# Patient Record
Sex: Male | Born: 1995 | Race: White | Hispanic: No | Marital: Single | Smoking: Never smoker
Health system: Southern US, Community
[De-identification: ages and names within clinical notes are randomized; demographics above are authoritative.]

---

## 2020-05-05 ENCOUNTER — Other Ambulatory Visit: Payer: Self-pay

## 2020-05-05 ENCOUNTER — Emergency Department (HOSPITAL_COMMUNITY): Payer: BC Managed Care – PPO

## 2020-05-05 ENCOUNTER — Emergency Department (HOSPITAL_COMMUNITY)
Admission: EM | Admit: 2020-05-05 | Discharge: 2020-05-05 | Disposition: A | Discharge status: Home / Self Care | Payer: BC Managed Care – PPO | Attending: Emergency Medicine | Admitting: Emergency Medicine

## 2020-05-05 ENCOUNTER — Encounter (HOSPITAL_COMMUNITY): Payer: Self-pay | Admitting: *Deleted

## 2020-05-05 DIAGNOSIS — R1084 Generalized abdominal pain: Secondary | ICD-10-CM | POA: Insufficient documentation

## 2020-05-05 DIAGNOSIS — R112 Nausea with vomiting, unspecified: Secondary | ICD-10-CM | POA: Insufficient documentation

## 2020-05-05 DIAGNOSIS — R197 Diarrhea, unspecified: Secondary | ICD-10-CM | POA: Insufficient documentation

## 2020-05-05 DIAGNOSIS — R109 Unspecified abdominal pain: Secondary | ICD-10-CM | POA: Diagnosis present

## 2020-05-05 LAB — URINALYSIS, ROUTINE W REFLEX MICROSCOPIC
Bilirubin Urine: NEGATIVE
Glucose, UA: NEGATIVE mg/dL
Hgb urine dipstick: NEGATIVE
Ketones, ur: NEGATIVE mg/dL
Leukocytes,Ua: NEGATIVE
Nitrite: NEGATIVE
Protein, ur: NEGATIVE mg/dL
Specific Gravity, Urine: 1.027 (ref 1.005–1.030)
pH: 5 (ref 5.0–8.0)

## 2020-05-05 LAB — CBC
HCT: 51.5 % (ref 39.0–52.0)
Hemoglobin: 17.1 g/dL — ABNORMAL HIGH (ref 13.0–17.0)
MCH: 30.9 pg (ref 26.0–34.0)
MCHC: 33.2 g/dL (ref 30.0–36.0)
MCV: 93 fL (ref 80.0–100.0)
Platelets: 241 10*3/uL (ref 150–400)
RBC: 5.54 MIL/uL (ref 4.22–5.81)
RDW: 12.6 % (ref 11.5–15.5)
WBC: 9 10*3/uL (ref 4.0–10.5)
nRBC: 0 % (ref 0.0–0.2)

## 2020-05-05 LAB — COMPREHENSIVE METABOLIC PANEL
ALT: 33 U/L (ref 0–44)
AST: 21 U/L (ref 15–41)
Albumin: 4.3 g/dL (ref 3.5–5.0)
Alkaline Phosphatase: 49 U/L (ref 38–126)
Anion gap: 10 (ref 5–15)
BUN: 15 mg/dL (ref 6–20)
CO2: 25 mmol/L (ref 22–32)
Calcium: 9 mg/dL (ref 8.9–10.3)
Chloride: 104 mmol/L (ref 98–111)
Creatinine, Ser: 1.02 mg/dL (ref 0.61–1.24)
GFR, Estimated: >60 mL/min (ref >60)
Glucose, Bld: 98 mg/dL (ref 70–99)
Potassium: 3.6 mmol/L (ref 3.5–5.1)
Sodium: 139 mmol/L (ref 135–145)
Total Bilirubin: 1.2 mg/dL (ref 0.3–1.2)
Total Protein: 7.4 g/dL (ref 6.5–8.1)

## 2020-05-05 LAB — LIPASE, BLOOD: Lipase: 22 U/L (ref 11–51)

## 2020-05-05 MED ORDER — DICYCLOMINE HCL 20 MG PO TABS: 20.0000 mg | ORAL_TABLET | Freq: Two times a day (BID) | ORAL | 0 refills | Status: AC

## 2020-05-05 MED ORDER — IOHEXOL 300 MG/ML  SOLN
100.0000 mL | Freq: Once | INTRAMUSCULAR | Status: AC | PRN
  Administered 2020-05-05: 100 mL via INTRAVENOUS

## 2020-05-05 MED ORDER — DICYCLOMINE HCL 10 MG PO CAPS
10.0000 mg | ORAL_CAPSULE | Freq: Once | ORAL | Status: AC
  Administered 2020-05-05: 10 mg via ORAL
  Filled 2020-05-05: qty 1

## 2020-05-05 MED ORDER — LOPERAMIDE HCL 2 MG PO CAPS: 2.0000 mg | ORAL_CAPSULE | Freq: Four times a day (QID) | ORAL | 0 refills | Status: AC | PRN

## 2020-05-05 MED ORDER — ONDANSETRON 4 MG PO TBDP: 4.0000 mg | ORAL_TABLET | Freq: Three times a day (TID) | ORAL | 0 refills | Status: AC | PRN

## 2020-05-05 NOTE — Discharge Instructions (Addendum)
Take the medications as prescribed  Bentyl for abdominal pain Zofran for nausea Imodium for diarrhea  Return for new or worsening symptoms

## 2020-05-05 NOTE — ED Provider Notes (Signed)
[facility] EMERGENCY DEPARTMENT Provider Note   CSN: [id number] Arrival date & time: 05/05/20  1234    History Chief Complaint  Patient presents with  . Abdominal Pain    Darin Barrera is a 25 y.o. male with no significant past medical history who presents for evaluation of abd pain and diarrhea. Began at 6 am this morning. Located to RLQ and then migrated to Right mid and upper abdomen. No similar pain. 2 episodes of NBNB emesis. No melena or BRBPR.  Not taken anything for pain.  He has no pain with food intake.  No fever, chills, chest pain, shortness of breath dysuria, hematuria, flank pain.  Denies additional aggravating or alleviating factors.  No recent COVID exposures.  Rates his current pain a 3/10.  Does not want anything for pain at this time.  History obtained from patient and past medical records.  No interpreter used.  HPI     History reviewed. No pertinent past medical history.  There are no problems to display for this patient.   History reviewed. No pertinent surgical history.     No family history on file.  Social History   Tobacco Use  . Smoking status: Never Smoker  . Smokeless tobacco: Never Used  Vaping Use  . Vaping Use: Never used  Substance Use Topics  . Alcohol use: Yes  . Drug use: Never    Home Medications Prior to Admission medications   Medication Sig Start Date End Date Taking? Authorizing Provider  dicyclomine (BENTYL) 20 MG tablet Take 1 tablet (20 mg total) by mouth 2 (two) times daily. 05/05/20  Yes Donni Oglesby A, PA-C  loperamide (IMODIUM) 2 MG capsule Take 1 capsule (2 mg total) by mouth 4 (four) times daily as needed for diarrhea or loose stools. 05/05/20  Yes Lovely Kerins A, PA-C  ondansetron (ZOFRAN ODT) 4 MG disintegrating tablet Take 1 tablet (4 mg total) by mouth every 8 (eight) hours as needed for nausea or vomiting. 05/05/20  Yes Vara Mairena A, PA-C    Allergies    Tylenol with codeine #3  [acetaminophen-codeine]  Review of Systems   Review of Systems  Constitutional: Negative.   HENT: Negative.   Respiratory: Negative.   Cardiovascular: Negative.   Gastrointestinal: Positive for abdominal pain, diarrhea, nausea and vomiting. Negative for abdominal distention, anal bleeding, blood in stool, constipation and rectal pain.  Genitourinary: Negative.   Musculoskeletal: Negative.   Skin: Negative.   Neurological: Negative.   All other systems reviewed and are negative.   Physical Exam Updated Vital Signs BP 129/69   Pulse 96   Temp 98.7 F (37.1 C) (Oral)   Resp 18   Ht 5\' 11"  (1.803 m)   Wt 108.9 kg   SpO2 99%   BMI 33.47 kg/m   Physical Exam Vitals and nursing note reviewed.  Constitutional:      General: He is not in acute distress.    Appearance: He is well-developed and well-nourished. He is not ill-appearing, toxic-appearing or diaphoretic.  HENT:     Head: Normocephalic and atraumatic.     Mouth/Throat:     Mouth: Mucous membranes are moist.  Eyes:     Pupils: Pupils are equal, round, and reactive to light.  Cardiovascular:     Rate and Rhythm: Normal rate and regular rhythm.     Heart sounds: Normal heart sounds.  Pulmonary:     Effort: Pulmonary effort is normal. No respiratory distress.  Abdominal:     General:  Bowel sounds are normal. There is no distension.     Palpations: Abdomen is soft.     Tenderness: There is abdominal tenderness in the right upper quadrant, right lower quadrant, epigastric area, periumbilical area and suprapubic area. There is no right CVA tenderness, left CVA tenderness, guarding or rebound. Negative signs include Murphy's sign and McBurney's sign.     Hernia: No hernia is present.  Musculoskeletal:        General: Normal range of motion.     Cervical back: Normal range of motion and neck supple.  Skin:    General: Skin is warm and dry.     Capillary Refill: Capillary refill takes less than 2 seconds.  Neurological:      General: No focal deficit present.     Mental Status: He is alert.  Psychiatric:        Mood and Affect: Mood and affect normal.    ED Results / Procedures / Treatments   Labs (all labs ordered are listed, but only abnormal results are displayed) Labs Reviewed  CBC - Abnormal; Notable for the following components:      Result Value   Hemoglobin 17.1 (*)    All other components within normal limits  LIPASE, BLOOD  COMPREHENSIVE METABOLIC PANEL  URINALYSIS, ROUTINE W REFLEX MICROSCOPIC    EKG None  Radiology CT Abdomen Pelvis W Contrast  Result Date: 05/05/2020 CLINICAL DATA:  Abdominal pain with diarrhea, low mid abdominal pain now radiating to right lower quadrant with diarrhea. EXAM: CT ABDOMEN AND PELVIS WITH CONTRAST TECHNIQUE: Multidetector CT imaging of the abdomen and pelvis was performed using the standard protocol following bolus administration of intravenous contrast. CONTRAST:  OMNIPAQUE IOHEXOL 300 MG/ML  SOLN COMPARISON:  None. FINDINGS: Lower chest: Lung bases are clear. Normal heart size. No pericardial effusion. Hepatobiliary: Diffuse hepatic hypoattenuation compatible with hepatic steatosis. Sparing seen along the gallbladder fossa. No concerning focal liver lesion. Smooth liver surface contour. Normal gallbladder and biliary tree without visible calcified gallstone. Pancreas: No pancreatic ductal dilatation or surrounding inflammatory changes. Spleen: Normal in size. No concerning splenic lesions. Adrenals/Urinary Tract: Normal adrenal glands. Kidneys are normally located with symmetric enhancement. No suspicious renal lesion, urolithiasis or hydronephrosis. Urinary bladder is unremarkable. Stomach/Bowel: Distal esophagus, stomach and duodenum are free of acute abnormality. Slightly fluid-filled appearance of the distal small bowel is nonspecific. Colon is diffusely fluid-filled as well with a notable lack of formed stool. No significant colonic or small bowel  thickening is seen. No evidence of bowel obstruction. Normal caliber, air-filled appendix in the right lower quadrant coursing towards midline without focal periappendiceal inflammation. Vascular/Lymphatic: No significant vascular findings are present. No enlarged abdominal or pelvic lymph nodes. Reproductive: The prostate and seminal vesicles are unremarkable. Other: No abdominopelvic free fluid or free gas. No bowel containing hernias. Musculoskeletal: No acute osseous abnormality or suspicious osseous lesion. IMPRESSION: 1. Normal appendix. 2. Slightly fluid-filled appearance of the distal small bowel and colon with a notable lack of formed stool. Findings are nonspecific, but can be seen with enterocolitis and a rapid transit state. 3. Hepatic steatosis. Electronically Signed   By: Kreg Shropshire M.D.   On: 05/05/2020 19:29    Procedures Procedures (including critical care time)  Medications Ordered in ED Medications  dicyclomine (BENTYL) capsule 10 mg (has no administration in time range)  iohexol (OMNIPAQUE) 300 MG/ML solution 100 mL (100 mLs Intravenous Contrast Given 05/05/20 1905)    ED Course  I have reviewed the triage vital  signs and the nursing notes.  Pertinent labs & imaging results that were available during my care of the patient were reviewed by me and considered in my medical decision making (see chart for details).   Patient with nausea, vomiting, diarrhea and abdominal pain.  Afebrile, nonseptic, not ill-appearing. Somewhat right lower quadrant.  No urinary complaints.  GU exam without significant findings.  Heart lungs clear.  Abdomen soft.  Negative Murphy sign, McBurney point.  Does not want a thing for pain at this time.  Plan labs, imaging and reassess.  Labs and imaging personally reviewed and interpreted:  CBC without leukocytosis CMP without electrolyte, renal or liver abnormality UA negative for infection Lipase 22  CT AP without appendicitis. Does have fluid  stool in colon.  Patient reassessed. Appear otherwise well. Suspect gastroenteritis.  DC home with Bentyl, Imodium, push fluids.  Patient is nontoxic, nonseptic appearing, in no apparent distress.  Patient's pain and other symptoms adequately managed in emergency department.  Fluid bolus given.  Labs, imaging and vitals reviewed.  Patient does not meet the SIRS or Sepsis criteria.  On repeat exam patient does not have a surgical abdomin and there are no peritoneal signs.  No indication of appendicitis, bowel obstruction, bowel perforation, cholecystitis, diverticulitis, torsion, UTI, kidney stone, incarceration, strangulated hernia.    The patient has been appropriately medically screened and/or stabilized in the ED. I have low suspicion for any other emergent medical condition which would require further screening, evaluation or treatment in the ED or require inpatient management.  Patient is hemodynamically stable and in no acute distress.  Patient able to ambulate in department prior to ED.  Evaluation does not show acute pathology that would require ongoing or additional emergent interventions while in the emergency department or further inpatient treatment.  I have discussed the diagnosis with the patient and answered all questions.  Pain is been managed while in the emergency department and patient has no further complaints prior to discharge.  Patient is comfortable with plan discussed in room and is stable for discharge at this time.  I have discussed strict return precautions for returning to the emergency department.  Patient was encouraged to follow-up with PCP/specialist refer to at discharge.     MDM Rules/Calculators/A&P                           Final Clinical Impression(s) / ED Diagnoses Final diagnoses:  Nausea vomiting and diarrhea  Generalized abdominal pain    Rx / DC Orders ED Discharge Orders         Ordered    dicyclomine (BENTYL) 20 MG tablet  2 times daily         05/05/20 1954    loperamide (IMODIUM) 2 MG capsule  4 times daily PRN        05/05/20 1954    ondansetron (ZOFRAN ODT) 4 MG disintegrating tablet  Every 8 hours PRN        05/05/20 1954           Tamberly Pomplun A, PA-C 05/05/20 1956    Derwood Kaplan, MD 05/05/20 2322

## 2020-05-05 NOTE — ED Notes (Signed)
EDP in room to speak with pt and pt's mother.

## 2020-05-05 NOTE — ED Triage Notes (Signed)
Abdominal pain with diarrhea 

## 2022-08-25 IMAGING — CT CT ABD-PELV W/ CM
2 of 4 series · 16 of 46 positions shown, 18 images · IV contrast (omnipaque)
Comparison: None.

CLINICAL DATA: Abdominal pain with diarrhea, low mid abdominal pain
now radiating to right lower quadrant with diarrhea.

EXAM:
CT ABDOMEN AND PELVIS WITH CONTRAST
TECHNIQUE: Multidetector CT imaging of the abdomen and pelvis was performed
using the standard protocol following bolus administration of
intravenous contrast.
CONTRAST:  100mL OMNIPAQUE IOHEXOL 300 MG/ML  SOLN

[Series 2: axial st · axial · 0.74mm/px · z∈[+1319,+1769]mm · 13 of 98 slices shown, 15 images]
[im 4/98  soft-tissue]
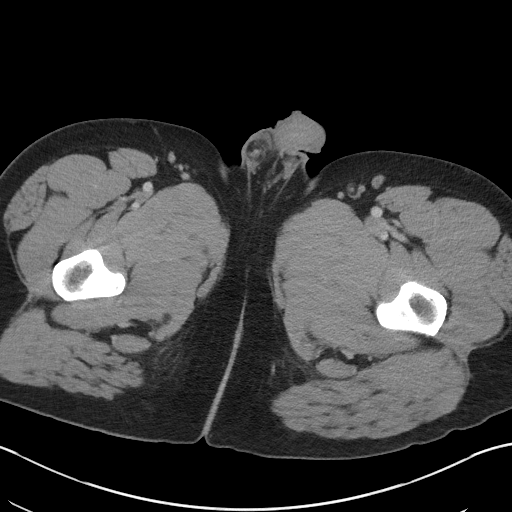
[im 4/98  bone]
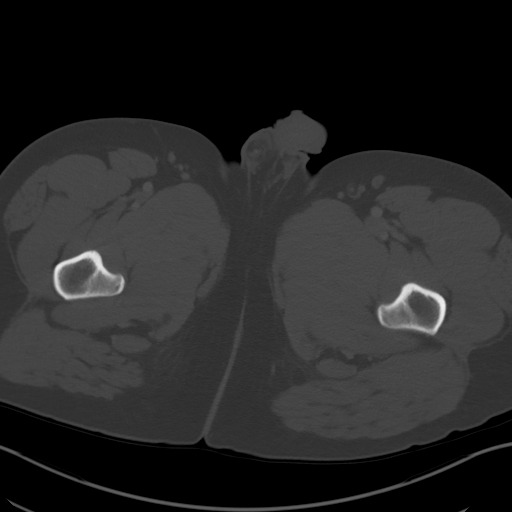
[im 12/98  soft-tissue]
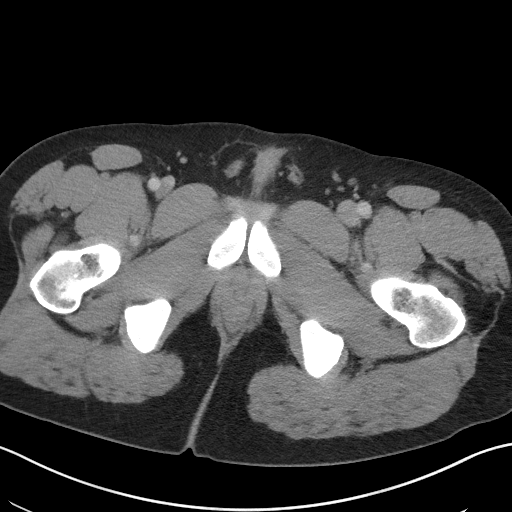
[im 19/98  soft-tissue]
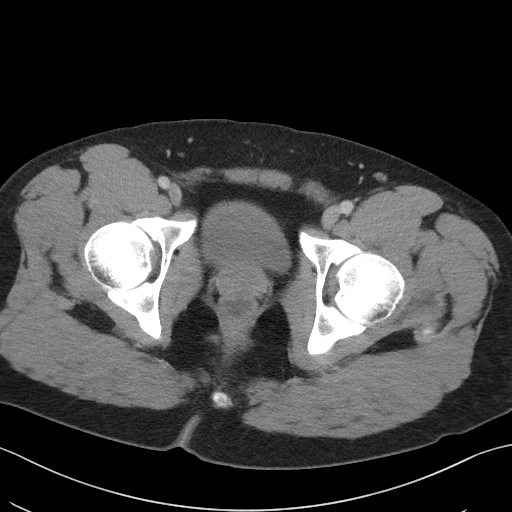
[im 27/98  soft-tissue]
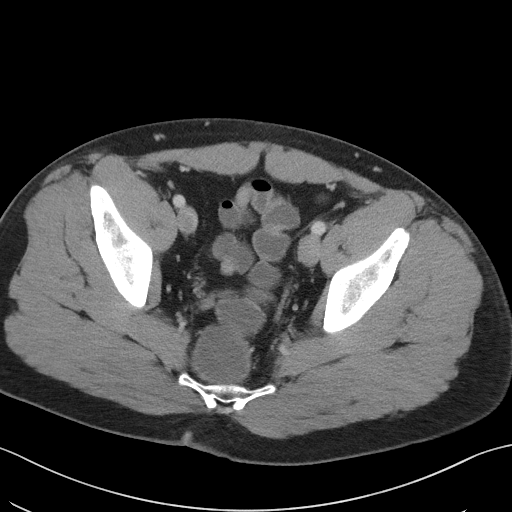
[im 34/98  soft-tissue]
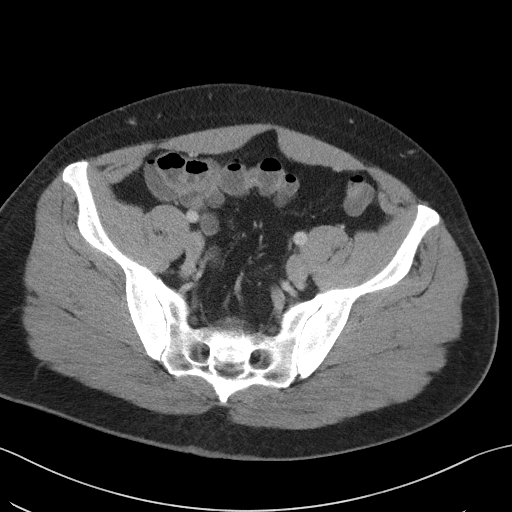
[im 42/98  soft-tissue]
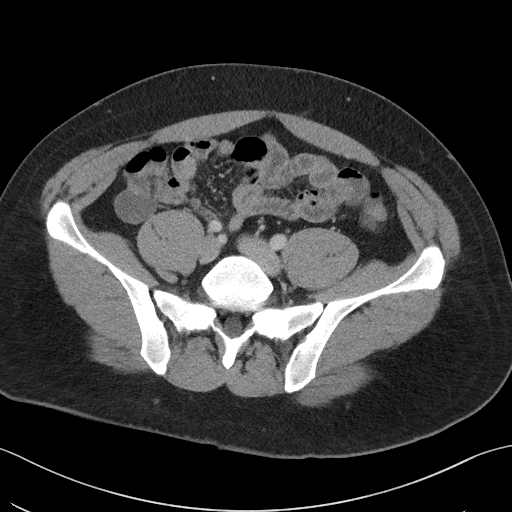
[im 49/98  soft-tissue]
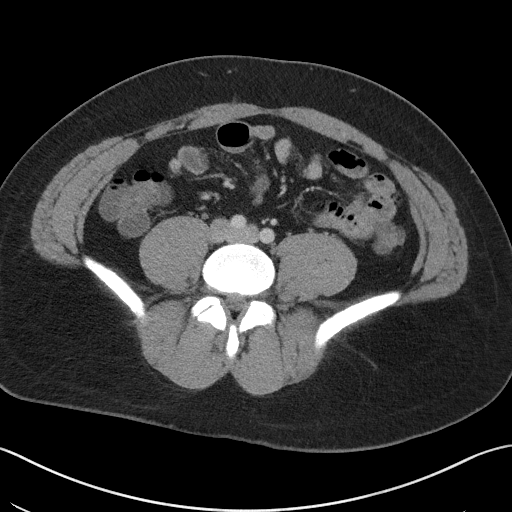
[im 56/98  soft-tissue]
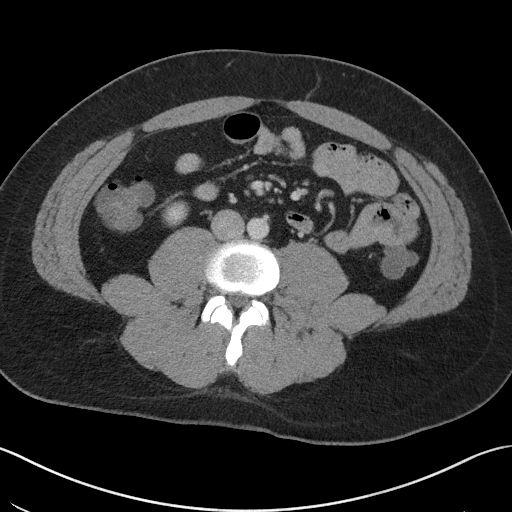
[im 64/98  soft-tissue]
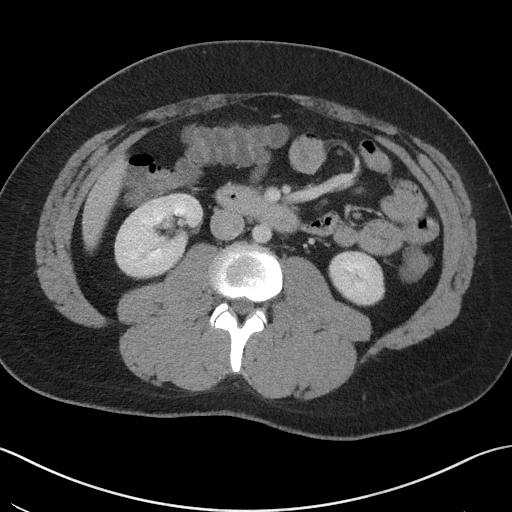
[im 64/98  bone]
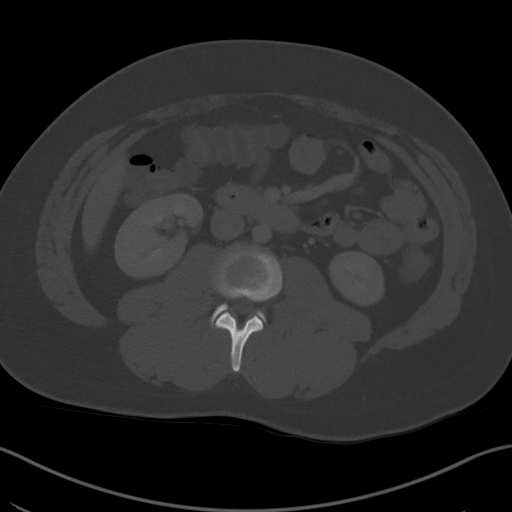
[im 71/98  soft-tissue]
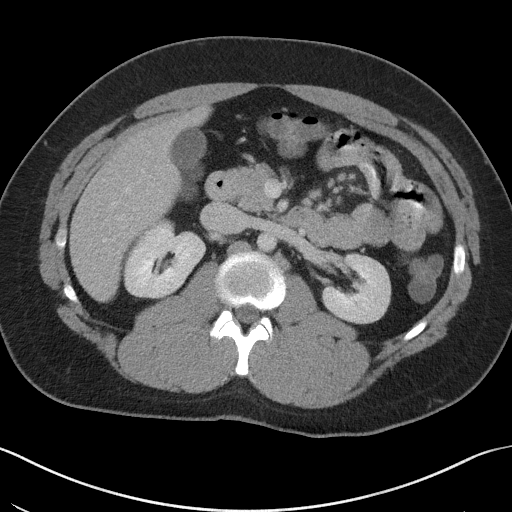
[im 79/98  soft-tissue]
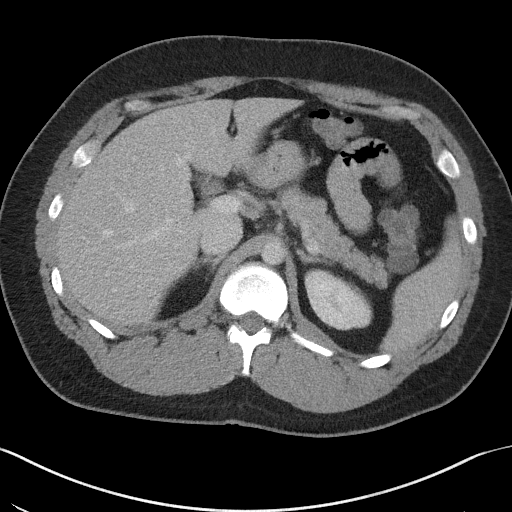
[im 86/98  soft-tissue]
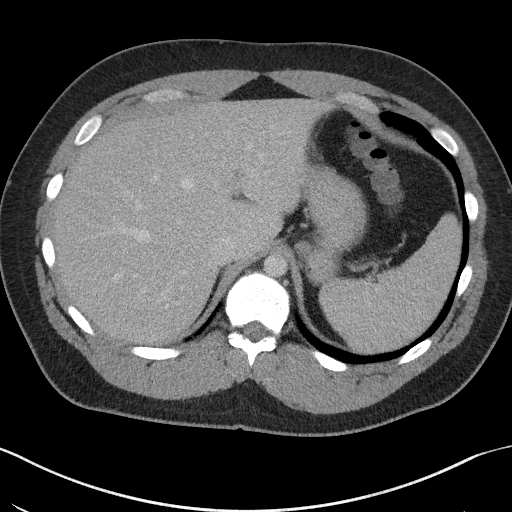
[im 94/98  soft-tissue]
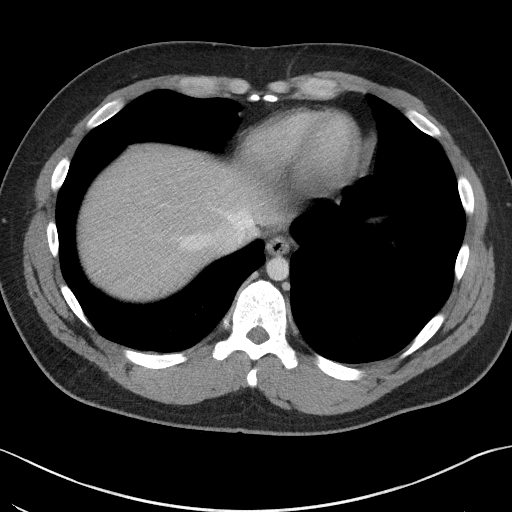

[Series 5: coronal st · coronal · 0.76mm/px · 3 of 100 slices shown]
[im 34/100  soft-tissue]
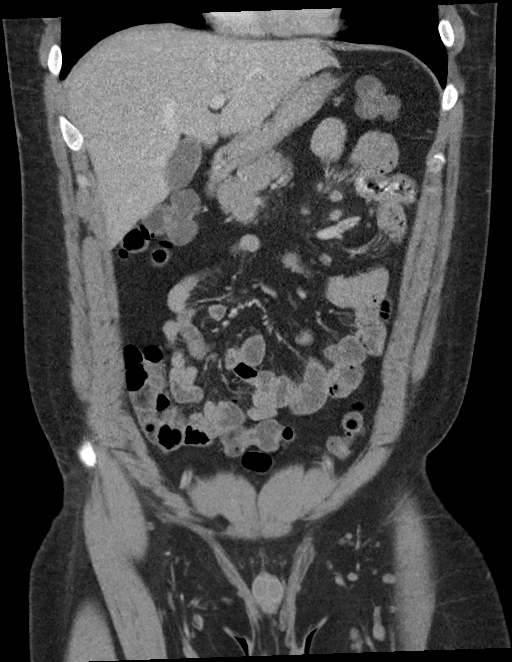
[im 45/100  soft-tissue]
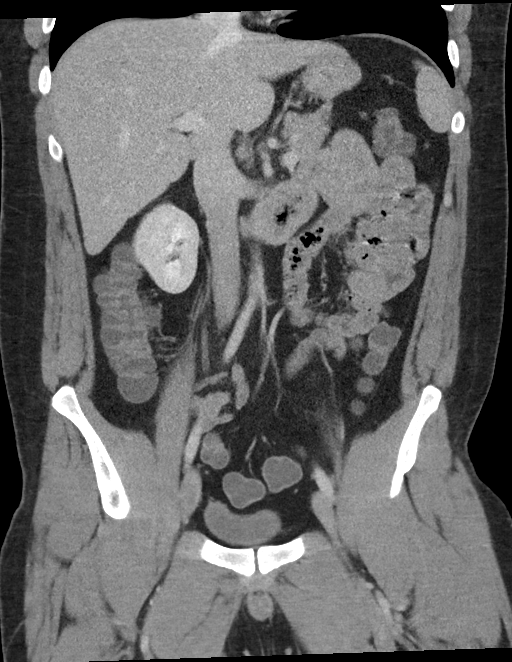
[im 56/100  soft-tissue]
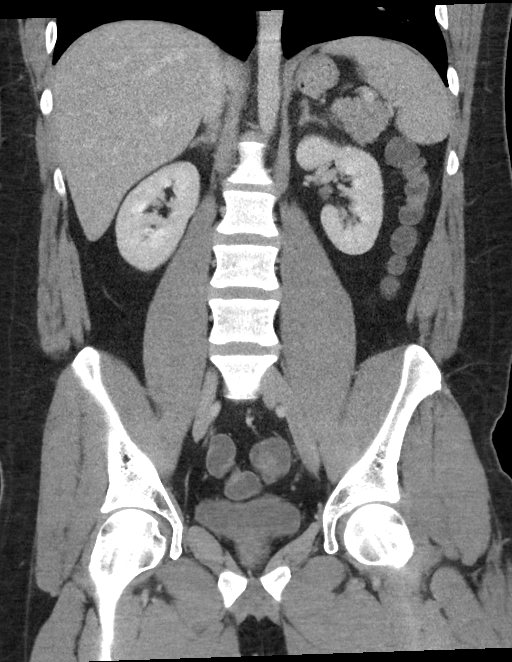

[16 of 46 positions shown; findings below may reference images not displayed]

FINDINGS: Lower chest: Lung bases are clear. Normal heart size. No pericardial
effusion.

Hepatobiliary: Diffuse hepatic hypoattenuation compatible with
hepatic steatosis. Sparing seen along the gallbladder fossa. No
concerning focal liver lesion. Smooth liver surface contour. Normal
gallbladder and biliary tree without visible calcified gallstone.

Pancreas: No pancreatic ductal dilatation or surrounding
inflammatory changes.

Spleen: Normal in size. No concerning splenic lesions.

Adrenals/Urinary Tract: Normal adrenal glands. Kidneys are normally
located with symmetric enhancement. No suspicious renal lesion,
urolithiasis or hydronephrosis. Urinary bladder is unremarkable.

Stomach/Bowel: Distal esophagus, stomach and duodenum are free of
acute abnormality. Slightly fluid-filled appearance of the distal
small bowel is nonspecific. Colon is diffusely fluid-filled as well
with a notable lack of formed stool. No significant colonic or small
bowel thickening is seen. No evidence of bowel obstruction. Normal
caliber, air-filled appendix in the right lower quadrant coursing
towards midline without focal periappendiceal inflammation.

Vascular/Lymphatic: No significant vascular findings are present. No
enlarged abdominal or pelvic lymph nodes.

Reproductive: The prostate and seminal vesicles are unremarkable.

Other: No abdominopelvic free fluid or free gas. No bowel containing
hernias.

Musculoskeletal: No acute osseous abnormality or suspicious osseous
lesion.
IMPRESSION: 1. Normal appendix.
2. Slightly fluid-filled appearance of the distal small bowel and
colon with a notable lack of formed stool. Findings are nonspecific,
but can be seen with enterocolitis and a rapid transit state.
3. Hepatic steatosis.
